# Patient Record
Sex: Male | Born: 1985 | Race: Black or African American | Hispanic: No | Marital: Single | Smoking: Never smoker
Health system: Southern US, Community
[De-identification: ages and names within clinical notes are randomized; demographics above are authoritative.]

---

## 2005-08-16 ENCOUNTER — Emergency Department (HOSPITAL_COMMUNITY): Admission: EM | Admit: 2005-08-16 | Discharge: 2005-08-17 | Payer: Self-pay | Admitting: Emergency Medicine

## 2005-08-19 ENCOUNTER — Emergency Department (HOSPITAL_COMMUNITY): Admission: EM | Admit: 2005-08-19 | Discharge: 2005-08-19 | Payer: Self-pay | Admitting: Family Medicine

## 2006-03-11 ENCOUNTER — Emergency Department (HOSPITAL_COMMUNITY): Admission: EM | Admit: 2006-03-11 | Discharge: 2006-03-11 | Payer: Self-pay | Admitting: Emergency Medicine

## 2006-03-28 ENCOUNTER — Emergency Department (HOSPITAL_COMMUNITY): Admission: EM | Admit: 2006-03-28 | Discharge: 2006-03-28 | Payer: Self-pay | Admitting: Family Medicine

## 2008-06-03 IMAGING — CR DG CHEST 2V
2 series · 2 of 2 positions shown · non-contrast
Comparison: 08/16/2005.

CLINICAL DATA: Left chest and flank pain for the past 4 weeks. Weight lifter.

CHEST - 2 VIEW

[view not recorded (1 of 2)]
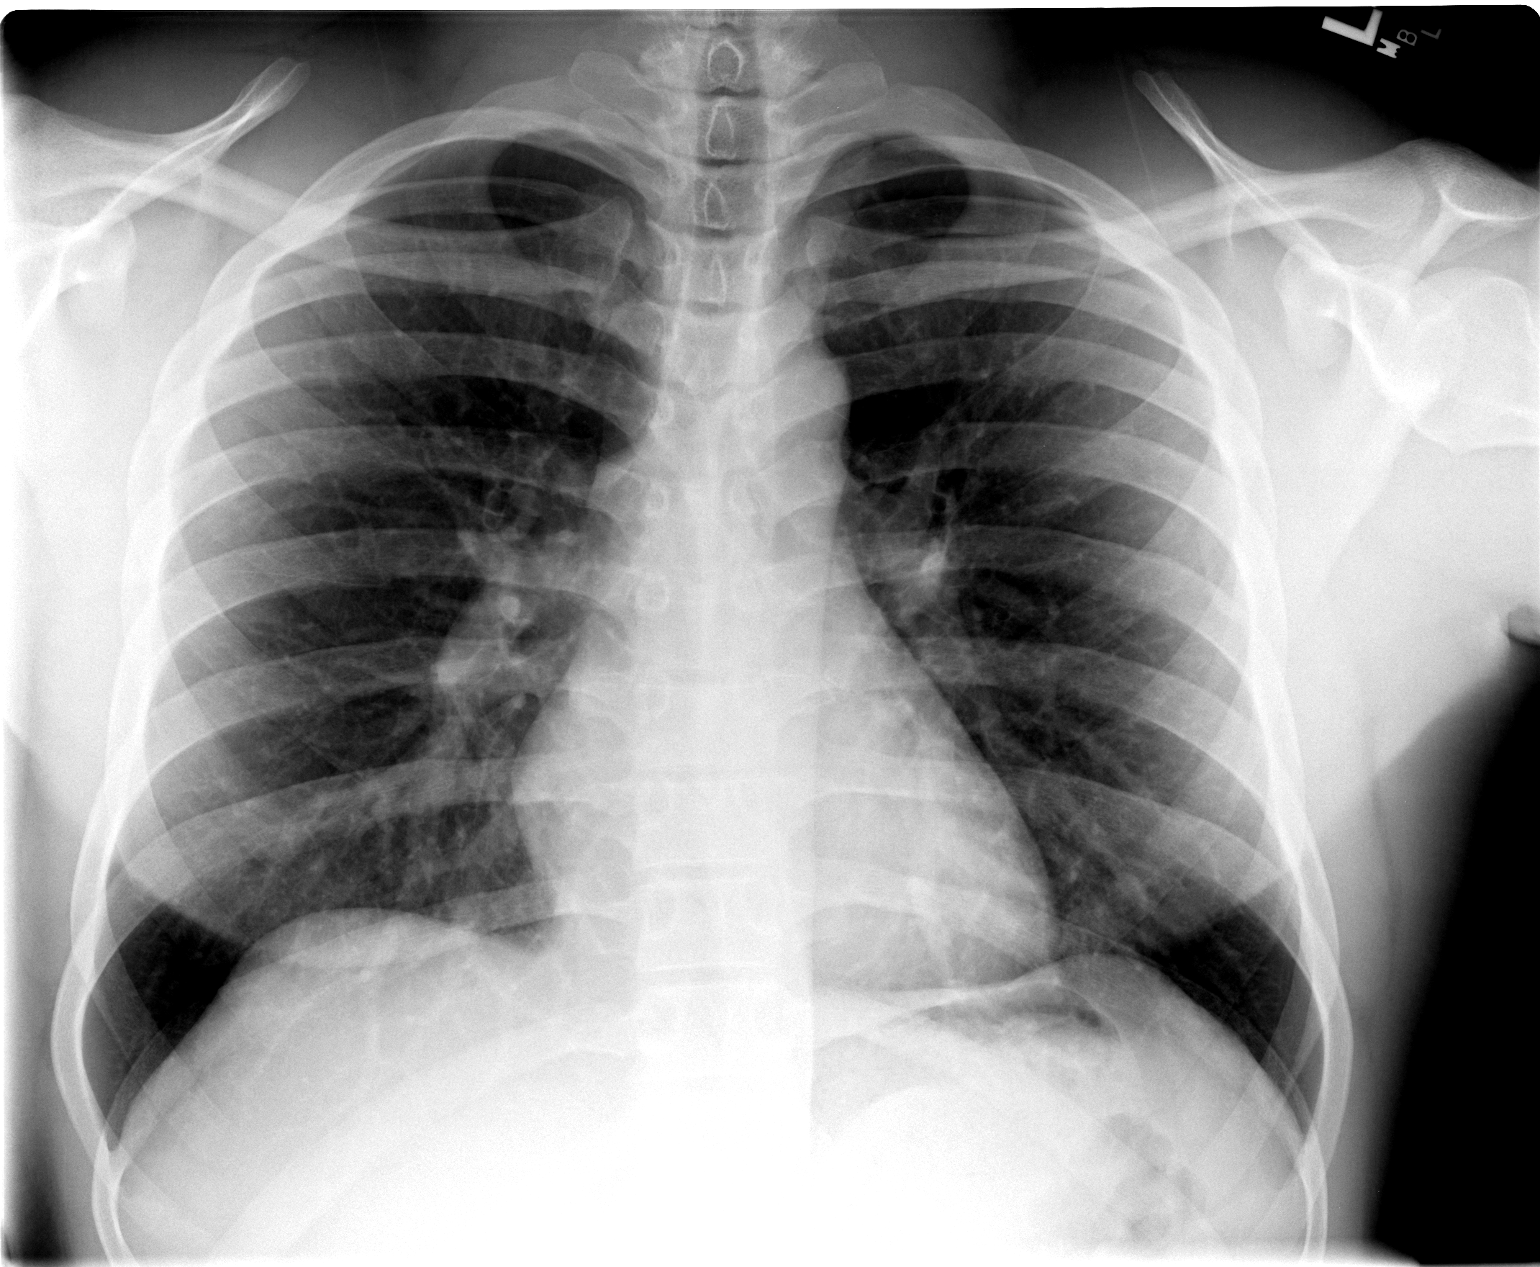

[view not recorded (2 of 2)]
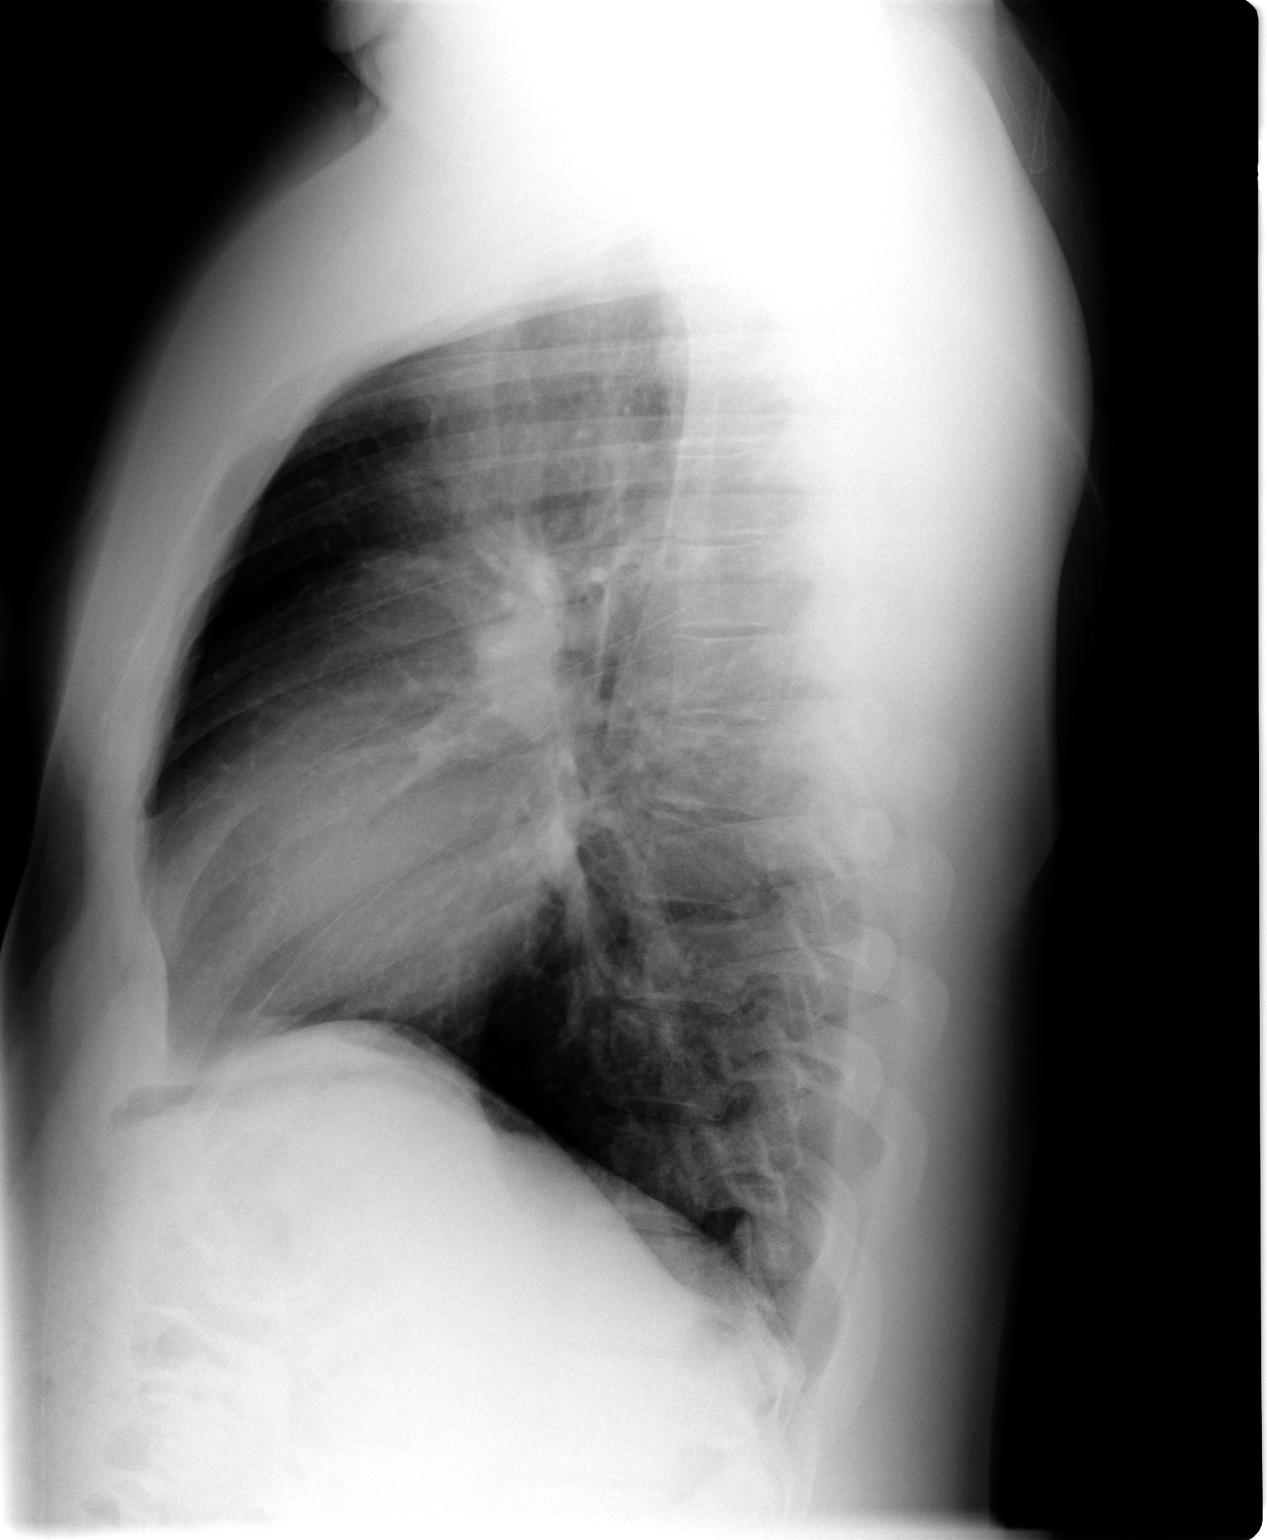

[2 of 2 positions shown; findings below may reference images not displayed]

FINDINGS: Normal sized heart. Stable mild diffuse peribronchial thickening.
Unremarkable bones.

IMPRESSION

Stable mild chronic bronchitic changes. No acute abnormality.

## 2013-11-09 ENCOUNTER — Other Ambulatory Visit (HOSPITAL_COMMUNITY)
Admission: RE | Admit: 2013-11-09 | Discharge: 2013-11-09 | Disposition: A | Discharge status: Home / Self Care | Payer: Self-pay | Source: Ambulatory Visit | Attending: Family Medicine | Admitting: Family Medicine

## 2013-11-09 ENCOUNTER — Encounter (HOSPITAL_COMMUNITY): Payer: Self-pay | Admitting: Emergency Medicine

## 2013-11-09 ENCOUNTER — Emergency Department (INDEPENDENT_AMBULATORY_CARE_PROVIDER_SITE_OTHER)
Admission: EM | Admit: 2013-11-09 | Discharge: 2013-11-09 | Disposition: A | Discharge status: Home / Self Care | Payer: Self-pay | Source: Home / Self Care | Attending: Family Medicine | Admitting: Family Medicine

## 2013-11-09 DIAGNOSIS — R319 Hematuria, unspecified: Secondary | ICD-10-CM

## 2013-11-09 DIAGNOSIS — Z113 Encounter for screening for infections with a predominantly sexual mode of transmission: Secondary | ICD-10-CM | POA: Insufficient documentation

## 2013-11-09 DIAGNOSIS — R369 Urethral discharge, unspecified: Secondary | ICD-10-CM

## 2013-11-09 LAB — POCT URINALYSIS DIP (DEVICE)
Bilirubin Urine: NEGATIVE
GLUCOSE, UA: NEGATIVE mg/dL
Ketones, ur: NEGATIVE mg/dL
Nitrite: NEGATIVE
Protein, ur: NEGATIVE mg/dL
SPECIFIC GRAVITY, URINE: 1.015 (ref 1.005–1.030)
Urobilinogen, UA: 0.2 mg/dL (ref 0.0–1.0)
pH: 7 (ref 5.0–8.0)

## 2013-11-09 LAB — HIV ANTIBODY (ROUTINE TESTING W REFLEX): HIV: NONREACTIVE

## 2013-11-09 LAB — RPR

## 2013-11-09 NOTE — ED Notes (Signed)
Verified call back number.

## 2013-11-09 NOTE — Discharge Instructions (Signed)
There may be a urinary tract infection but we will need to wait until the rest of your labwork comes back to better know.  We will call you if anything comes back in your lab work that needs treatment.  Please come back if your symptoms worsen,.

## 2013-11-09 NOTE — ED Notes (Signed)
Pt reports white penile d/c onset this am  Also reports seeing blood on Saturday night when voiding urine Denies inj/trauma, abd pain Alert w/no signs of acute distress.

## 2013-11-09 NOTE — ED Provider Notes (Signed)
CSN: [id number]     Arrival date & time 11/09/13  1112 History   First MD Initiated Contact with Patient 11/09/13 1206     Chief Complaint  Patient presents with  . Penile Discharge   (Consider location/radiation/quality/duration/timing/severity/associated sxs/prior Treatment) HPI  Penile discharge.: started about 4 wks ago. Noticed in the morning prior to taking shower. Milky in nature. Denies any lesions, inguinal nodes, abd pain, frequency, dysuria. Sexually active w/ women w/o using condoms all the time. No known partners w/ STDs. Discharge is intermittent and not associated w/ intercourse.    History reviewed. No pertinent past medical history. History reviewed. No pertinent past surgical history. No family history on file. History  Substance Use Topics  . Smoking status: Never Smoker   . Smokeless tobacco: Not on file  . Alcohol Use: Yes    Review of Systems Per HPI with all other pertinent systems negative.   Allergies  Review of patient's allergies indicates no known allergies.  Home Medications   Prior to Admission medications   Not on File   BP 138/78  Pulse 65  Temp(Src) 98.4 F (36.9 C) (Oral)  Resp 16  SpO2 100% Physical Exam  Constitutional: He is oriented to person, place, and time. He appears well-developed and well-nourished. No distress.  HENT:  Head: Normocephalic and atraumatic.  Eyes: EOM are normal. Pupils are equal, round, and reactive to light.  Neck: Normal range of motion.  Cardiovascular: Normal rate, normal heart sounds and intact distal pulses.   No murmur heard. Pulmonary/Chest: Effort normal and breath sounds normal. No respiratory distress.  Abdominal: Soft. He exhibits no distension.  Genitourinary:  No groin lymphadenopathy  Musculoskeletal: Normal range of motion. He exhibits no edema and no tenderness.  Neurological: He is alert and oriented to person, place, and time.  Skin: No rash noted. He is not diaphoretic.  Psychiatric:  He has a normal mood and affect. His behavior is normal. Judgment and thought content normal.    ED Course  Procedures (including critical care time) Labs Review Labs Reviewed  POCT URINALYSIS DIP (DEVICE) - Abnormal; Notable for the following:    Hgb urine dipstick TRACE (*)    Leukocytes, UA MODERATE (*)    All other components within normal limits  HIV ANTIBODY (ROUTINE TESTING)  RPR  URINE CYTOLOGY ANCILLARY ONLY    Imaging Review No results found.   MDM   1. Penile discharge   2. Hematuria    Penile discharge: leuks on UA. Unlikely UA. Urine cytology sent for gonorrhea, chlamydia, trich. Urine Cx. HIV, RPR. Infectious process vs nml physiologic response to increased sexual activity or trauma from trying to stop his urine flow   Hematuria: no blood on todays UA. Likely from trauma from forcefully stopping urine flow while peeing and then noting hematuria x1. Resolved and no further workup needed unless reoccurs.  Precautions given and all questions answered  Shelly Flattenavid Merrell, MD Family Medicine 11/09/2013, 1:08 PM       Ozella Rocksavid J Merrell, MD 11/09/13 [phone]

## 2013-11-10 LAB — URINE CULTURE
Colony Count: NO GROWTH
Culture: NO GROWTH

## 2013-11-10 NOTE — ED Notes (Signed)
GC neg., Chlamydia pos., Trich neg., HIV/RPR non-reactive, Urine culture: No growth.  Message sent to Dr. Konrad DoloresMerrell. Vassie MoselleYork, Jerrico Covello M 11/10/2013

## 2013-11-12 ENCOUNTER — Telehealth (HOSPITAL_COMMUNITY): Payer: Self-pay | Admitting: *Deleted

## 2013-11-12 MED ORDER — AZITHROMYCIN 500 MG PO TABS: 1000.0000 mg | ORAL_TABLET | Freq: Once | ORAL | Status: AC

## 2013-11-12 NOTE — ED Notes (Addendum)
Pt. called in for his lab results.  Pt. verified x 2 and given results.  Pt. told that I had sent a message to Dr. Konrad DoloresMerrell for orders but his is not here today.  I told him I would ask another provider and get Rx.  [facility] [address]  I told him I would call back when Rx. was ready.  Pt. instructed to notify his partner, no sex for 1 week and to practice safe sex. Pt. told he can get [facility] by appointment.  Dr. Artis FlockKindl e[facility] [address] [facility].  I called him to let him know it should be ready to be picked up.   Vassie MoselleYork, Frank Pilger M 11/12/2013

## 2013-11-13 NOTE — ED Notes (Signed)
[facility] form [facility]. Vassie MoselleYork, Beckett Maden M 11/13/2013

## 2021-03-21 ENCOUNTER — Other Ambulatory Visit: Payer: Self-pay | Admitting: Otolaryngology

## 2021-03-21 DIAGNOSIS — K118 Other diseases of salivary glands: Secondary | ICD-10-CM

## 2021-04-04 ENCOUNTER — Ambulatory Visit
Admission: RE | Admit: 2021-04-04 | Discharge: 2021-04-04 | Disposition: A | Discharge status: Home / Self Care | Payer: BC Managed Care – PPO | Source: Ambulatory Visit | Attending: Otolaryngology | Admitting: Otolaryngology

## 2021-04-04 DIAGNOSIS — K118 Other diseases of salivary glands: Secondary | ICD-10-CM

## 2021-04-04 MED ORDER — IOPAMIDOL (ISOVUE-300) INJECTION 61%
75.0000 mL | Freq: Once | INTRAVENOUS | Status: AC | PRN
  Administered 2021-04-04: 75 mL via INTRAVENOUS
